# Patient Record
Sex: Female | Born: 1995 | Race: Black or African American | Hispanic: No | Marital: Single | Smoking: Never smoker
Health system: Southern US, Community
[De-identification: ages and names within clinical notes are randomized; demographics above are authoritative.]

## PROBLEM LIST (undated history)

## (undated) DIAGNOSIS — I10 Essential (primary) hypertension: Secondary | ICD-10-CM

## (undated) DIAGNOSIS — J45909 Unspecified asthma, uncomplicated: Secondary | ICD-10-CM

## (undated) HISTORY — PX: WISDOM TOOTH EXTRACTION: SHX21

## (undated) HISTORY — PX: TONSILLECTOMY: SUR1361

---

## 2015-07-17 ENCOUNTER — Encounter (HOSPITAL_COMMUNITY): Payer: Self-pay | Admitting: *Deleted

## 2015-07-17 ENCOUNTER — Emergency Department (HOSPITAL_COMMUNITY)
Admission: EM | Admit: 2015-07-17 | Discharge: 2015-07-17 | Disposition: A | Discharge status: Home / Self Care | Payer: 59 | Source: Home / Self Care | Attending: Family Medicine | Admitting: Family Medicine

## 2015-07-17 DIAGNOSIS — L600 Ingrowing nail: Secondary | ICD-10-CM

## 2015-07-17 HISTORY — DX: Essential (primary) hypertension: I10

## 2015-07-17 HISTORY — DX: Unspecified asthma, uncomplicated: J45.909

## 2015-07-17 NOTE — ED Notes (Signed)
Pt  Has  Symptoms  Of  A  Possible  Ingrown r  Toenail      She  Reports  Symptoms  Since  Last  Week

## 2015-07-17 NOTE — ED Provider Notes (Signed)
CSN: [id number]     Arrival date & time 07/17/15  1752 History   First MD Initiated Contact with Patient 07/17/15 1915     Chief Complaint  Patient presents with  . Ingrown Toenail   (Consider location/radiation/quality/duration/timing/severity/associated sxs/prior Treatment) Patient is a 19 y.o. female presenting with toe pain.  Toe Pain This is a new problem. The current episode started more than 1 week ago. The problem has been gradually worsening. Associated symptoms comments: Has been self treating rt gt toenail problem, continues sore..    Past Medical History  Diagnosis Date  . Asthma   . Hypertension    No past surgical history on file. No family history on file. Social History  Substance Use Topics  . Smoking status: Never Smoker   . Smokeless tobacco: None  . Alcohol Use: No   OB History    No data available     Review of Systems  Constitutional: Negative.   Musculoskeletal: Negative for joint swelling and gait problem.  Skin: Positive for wound.  All other systems reviewed and are negative.   Allergies  Review of patient's allergies indicates no known allergies.  Home Medications   Prior to Admission medications   Medication Sig Start Date End Date Taking? Authorizing Provider  Albuterol Sulfate (PROAIR HFA IN) Inhale into the lungs.   Yes Historical Provider, MD  Desvenlafaxine Succinate (PRISTIQ PO) Take by mouth.   Yes Historical Provider, MD  EPINEPHrine 0.15 MG/0.15ML IJ injection Inject 0.15 mg into the muscle as needed for anaphylaxis.   Yes Historical Provider, MD  Fexofenadine HCl (ALLEGRA PO) Take by mouth.   Yes Historical Provider, MD  fluticasone (FLONASE) 50 MCG/ACT nasal spray Place into both nostrils daily.   Yes Historical Provider, MD  HydrOXYzine Pamoate (VISTARIL PO) Take by mouth.   Yes Historical Provider, MD  LamoTRIgine (LAMICTAL PO) Take by mouth.   Yes Historical Provider, MD  Levonorgestrel-Ethinyl Estrad (AVIANE PO) Take by  mouth.   Yes Historical Provider, MD  lisinopril-hydrochlorothiazide (PRINZIDE,ZESTORETIC) 20-12.5 MG tablet Take 1 tablet by mouth daily.   Yes Historical Provider, MD  Omeprazole (PRILOSEC PO) Take by mouth.   Yes Historical Provider, MD  Pimecrolimus (ELIDEL EX) Apply topically.   Yes Historical Provider, MD  Triamcinolone Acetonide (KENALOG EX) Apply topically.   Yes Historical Provider, MD   Meds Ordered and Administered this Visit  Medications - No data to display  BP 112/77 mmHg  Pulse 80  Temp(Src) 99.2 F (37.3 C) (Oral)  Resp 17  SpO2 100%  LMP 07/03/2015 No data found.   Physical Exam  Constitutional: She is oriented to person, place, and time. She appears well-developed and well-nourished.  Musculoskeletal: She exhibits tenderness.  Tender medial corner of right gt toenail, no drainage, sl granulation tissue exposed  Neurological: She is alert and oriented to person, place, and time.  Skin: Skin is warm and dry.  Nursing note and vitals reviewed.   ED Course  Procedures (including critical care time)  Labs Review Labs Reviewed - No data to display  Imaging Review No results found.   Visual Acuity Review  Right Eye Distance:   Left Eye Distance:   Bilateral Distance:    Right Eye Near:   Left Eye Near:    Bilateral Near:         MDM   1. Ingrown right big toenail       Linna HoffJames D Franciscojavier Wronski, MD 07/17/15 1950

## 2015-07-17 NOTE — Discharge Instructions (Signed)
Warm salt water soaks twice a day, see podiatrist when possible.

## 2015-07-18 NOTE — ED Notes (Signed)
Patient called w questions about her exam/treatmetn yesterday, concerned she needed an antibiotic for her toenail. Discussed w Dr Artis FlockKindl, and her instructions remain to soak in warm epsom salt solution and see foot doctor to have toenail  removed . Discussed w patient, and she has not yet made an appointment, trying to decide if she wants to have it done here in [place] or back home in [place]

## 2015-07-26 ENCOUNTER — Encounter: Payer: Self-pay | Admitting: Podiatry

## 2015-07-26 ENCOUNTER — Ambulatory Visit (INDEPENDENT_AMBULATORY_CARE_PROVIDER_SITE_OTHER): Payer: 59 | Admitting: Podiatry

## 2015-07-26 VITALS — BP 90/53 | HR 79 | Resp 18

## 2015-07-26 DIAGNOSIS — L600 Ingrowing nail: Secondary | ICD-10-CM | POA: Diagnosis not present

## 2015-07-26 MED ORDER — CEPHALEXIN 500 MG PO CAPS: 500.0000 mg | ORAL_CAPSULE | Freq: Three times a day (TID) | ORAL | Status: DC

## 2015-07-26 NOTE — Progress Notes (Signed)
Subjective:    Patient ID: Carrie Cervantes, female    DOB: 1996/08/02, 19 y.o.   MRN: [id number]  HPI  19 year old female presents the office with concerns of right inside border ingrown toenail which has been ongoing the last several weeks. She did go to urgent care however did not prescribe antibiotics. She does state that she does get some drainage from the nail borders she does it is tender and sore particularly with pressure. She describes the drainage is a clear bloody drainage but denies any pus currently. She's been soaking in Epson salt soaks. No other treatment. No other complaints at this time.  Review of Systems  All other systems reviewed and are negative.      Objective:   Physical Exam General: AAO x3, NAD  Dermatological: Skin is warm, dry and supple bilateral. On the medial border of the right hallux toenail is evidence incurvation along the nail border. There is tenderness palpation around the nail border. There is no drainage or purulence. There is localized edema to the area without any associated erythema, increase in warmth, ascending saline disc. There is no pain in the lateral nail border. There is no pain the remainder the toenails. There are no open sores, no preulcerative lesions, no rash or signs of infection present.  Vascular: Dorsalis Pedis artery and Posterior Tibial artery pedal pulses are 2/4 bilateral with immedate capillary fill time. Pedal hair growth present. No varicosities and no lower extremity edema present bilateral. There is no pain with calf compression, swelling, warmth, erythema.   Neruologic: Grossly intact via light touch bilateral. Vibratory intact via tuning fork bilateral. Protective threshold with Semmes Wienstein monofilament intact to all pedal sites bilateral. Patellar and Achilles deep tendon reflexes 2+ bilateral. No Babinski or clonus noted bilateral.   Musculoskeletal: No gross boney pedal deformities bilateral. No pain, crepitus, or  limitation noted with foot and ankle range of motion bilateral. Muscular strength 5/5 in all groups tested bilateral.  Gait: Unassisted, Nonantalgic.      Assessment & Plan:  19 year old female with right medial hallux ingrown toenail, symptomatic -Treatment options discussed including all alternatives, risks, and complications -At this time, the patient is requesting partial nail removal with chemical matricectomy to the symptomatic portion of the nail. Risks and complications were discussed with the patient for which they understand and  verbally consent to the procedure. Under sterile conditions a total of 3 mL of a mixture of 2% lidocaine plain and 0.5% Marcaine plain was infiltrated in a hallux block fashion. Once anesthetized, the skin was prepped in sterile fashion. No tourniquet was used as the patient does have a family history of sickle cell although she has never been tested. Next the medial aspect of hallux nail border was then sharply excised making sure to remove the entire offending nail border. Once the nails were ensured to be removed area was debrided and the underlying skin was intact. There is no purulence identified in the procedure. Hemostasis was achieved. Next, phenol was then applied under standard conditions and copiously irrigated. Silvadene was applied. A dry sterile dressing was applied. After application of the dressing there was found to be an immediate capillary refill time to the digit. The patient tolerated the procedure well any complications. Post procedure instructions were discussed the patient for which he verbally understood. Follow-up in one week for nail check or sooner if any problems are to arise. Discussed signs/symptoms of infection and directed to call the office immediately should any occur  or go directly to the emergency room. In the meantime, encouraged to call the office with any questions, concerns, changes symptoms. -Rx Keflex  Ovid CurdMatthew Wagoner, DPM

## 2015-07-26 NOTE — Patient Instructions (Signed)
If was nice to meet you today. If you have any questions or any further concerns, please feel fee to give me a call. You can call our office at [phone] or please feel fee to send me a message through MyChart.    Soak Instructions    THE DAY AFTER THE PROCEDURE  Place 1/4 cup of epsom salts in a quart of warm tap water.  Submerge your foot or feet with outer bandage intact for the initial soak; this will allow the bandage to become moist and wet for easy lift off.  Once you remove your bandage, continue to soak in the solution for 20 minutes.  This soak should be done twice a day.  Next, remove your foot or feet from solution, blot dry the affected area and cover.  You may use a band aid large enough to cover the area or use gauze and tape.  Apply other medications to the area as directed by the doctor such as polysporin neosporin.  IF YOUR SKIN BECOMES IRRITATED WHILE USING THESE INSTRUCTIONS, IT IS OKAY TO SWITCH TO  WHITE VINEGAR AND WATER. Or you may use antibacterial soap and water to keep the toe clean  Monitor for any signs/symptoms of infection. Call the office immediately if any occur or go directly to the emergency room. Call with any questions/concerns.   

## 2015-07-29 DIAGNOSIS — L600 Ingrowing nail: Secondary | ICD-10-CM | POA: Insufficient documentation

## 2015-08-06 ENCOUNTER — Encounter: Payer: Self-pay | Admitting: Podiatry

## 2015-08-06 ENCOUNTER — Ambulatory Visit (INDEPENDENT_AMBULATORY_CARE_PROVIDER_SITE_OTHER): Payer: 59 | Admitting: Podiatry

## 2015-08-06 DIAGNOSIS — L600 Ingrowing nail: Secondary | ICD-10-CM

## 2015-08-06 DIAGNOSIS — Z9889 Other specified postprocedural states: Secondary | ICD-10-CM

## 2015-08-06 NOTE — Patient Instructions (Signed)

## 2015-08-12 NOTE — Progress Notes (Signed)
Patient ID: Carrie Cervantes, female   DOB: 26-Feb-1996, 19 y.o.   MRN: [id number]  Subjective: Carrie Cervantes is a 19 y.o.  female returns to office today for follow up evaluation after having right medial Hallux permanent nail avulsion performed. Patient has been soaking using epsom and applying topical antibiotic covered with bandaid daily. She states that she is doing much better and she is not having any pain. Denies any swelling redness, drainage. No malodor. Patient denies fevers, chills, nausea, vomiting. Denies any calf pain, chest pain, SOB.   Objective:  Vitals: Reviewed  General: Well developed, nourished, in no acute distress, alert and oriented x3   Dermatology: Skin is warm, dry and supple bilateral. Right medial hallux nail border appears to be clean, dry, with mild granular tissue and surrounding scab. There is no surrounding erythema, edema, drainage/purulence. The remaining nails appear unremarkable at this time. There are no other lesions or other signs of infection present.  Neurovascular status: Intact. No lower extremity swelling; No pain with calf compression bilateral.  Musculoskeletal: Decreased tenderness to palpation of the right medial hallux nail fold. Muscular strength within normal limits bilateral.   Assesement and Plan: S/p partial nail avulsion, doing well.   -Continue soaking in epsom salts twice a day followed by antibiotic ointment and a band-aid. Can leave uncovered at night. Continue this until completely healed.  -If the area has not healed in 2 weeks, call the office for follow-up appointment, or sooner if any problems arise.  -Monitor for any signs/symptoms of infection. Call the office immediately if any occur or go directly to the emergency room. Call with any questions/concerns.  Ovid CurdMatthew Laraine Samet, DPM

## 2017-03-11 ENCOUNTER — Ambulatory Visit (HOSPITAL_COMMUNITY)
Admission: EM | Admit: 2017-03-11 | Discharge: 2017-03-11 | Disposition: A | Discharge status: Other Acute Inpatient Hospital | Payer: 59

## 2017-03-22 ENCOUNTER — Encounter (HOSPITAL_COMMUNITY): Payer: Self-pay | Admitting: Emergency Medicine

## 2017-03-22 ENCOUNTER — Ambulatory Visit (HOSPITAL_COMMUNITY)
Admission: EM | Admit: 2017-03-22 | Discharge: 2017-03-22 | Disposition: A | Discharge status: Home / Self Care | Payer: 59 | Attending: Internal Medicine | Admitting: Internal Medicine

## 2017-03-22 DIAGNOSIS — M545 Low back pain: Secondary | ICD-10-CM

## 2017-03-22 DIAGNOSIS — R0602 Shortness of breath: Secondary | ICD-10-CM | POA: Diagnosis not present

## 2017-03-22 MED ORDER — KETOROLAC TROMETHAMINE 60 MG/2ML IM SOLN
INTRAMUSCULAR | Status: AC
  Filled 2017-03-22: qty 2

## 2017-03-22 MED ORDER — NAPROXEN 500 MG PO TABS: 500.0000 mg | ORAL_TABLET | Freq: Two times a day (BID) | ORAL | 0 refills | Status: AC

## 2017-03-22 MED ORDER — KETOROLAC TROMETHAMINE 60 MG/2ML IM SOLN
60.0000 mg | Freq: Once | INTRAMUSCULAR | Status: AC
  Administered 2017-03-22: 60 mg via INTRAMUSCULAR
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
## 2017-03-22 NOTE — Discharge Instructions (Addendum)
No danger signs on exam today.  Injection of ketorolac (anti inflammatory/pain reliever) given at the urgent care today, with some improvement in pain.  Ice for 5-10 minutes several times daily should help reduce inflammation/pain in back.  Physical therapy is often helpful in reducing back pain recurrence and managing pain symptoms.  Since you are large breasted (cup size J), consider seeing a surgeon to discuss breast reduction, as you are young and already having back pain symptoms.

## 2017-03-22 NOTE — ED Triage Notes (Signed)
[facility] with a complaint a SOB and lower back pain x 2 weeks.
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
## 2018-01-31 ENCOUNTER — Encounter (HOSPITAL_COMMUNITY): Payer: Self-pay | Admitting: Emergency Medicine

## 2018-01-31 ENCOUNTER — Other Ambulatory Visit: Payer: Self-pay

## 2018-01-31 ENCOUNTER — Emergency Department (HOSPITAL_COMMUNITY): Payer: 59

## 2018-01-31 ENCOUNTER — Emergency Department (HOSPITAL_COMMUNITY)
Admission: EM | Admit: 2018-01-31 | Discharge: 2018-01-31 | Disposition: A | Discharge status: Home / Self Care | Payer: 59 | Attending: Emergency Medicine | Admitting: Emergency Medicine

## 2018-01-31 DIAGNOSIS — Z79899 Other long term (current) drug therapy: Secondary | ICD-10-CM | POA: Insufficient documentation

## 2018-01-31 DIAGNOSIS — J45909 Unspecified asthma, uncomplicated: Secondary | ICD-10-CM | POA: Diagnosis not present

## 2018-01-31 DIAGNOSIS — I10 Essential (primary) hypertension: Secondary | ICD-10-CM | POA: Insufficient documentation

## 2018-01-31 DIAGNOSIS — M79645 Pain in left finger(s): Secondary | ICD-10-CM

## 2018-01-31 MED ORDER — ACETAMINOPHEN 500 MG PO TABS: 1000.0000 mg | ORAL_TABLET | Freq: Once | ORAL | Status: DC
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
## 2018-01-31 NOTE — Discharge Instructions (Addendum)
X-ray did not show bony fracture or dislocations.  Pain may be from hyperextension injury.    Ice, rest, take anti-inflammatories such as tylenol and ibuprofen.   Follow up with primary care doctor and/or hand surgery if pain persists.
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
